# Patient Record
Sex: Female | Born: 1955 | Hispanic: No | Marital: Married | Smoking: Never smoker
Health system: Southern US, Community
[De-identification: ages and names within clinical notes are randomized; demographics above are authoritative.]

## PROBLEM LIST (undated history)

## (undated) DIAGNOSIS — K219 Gastro-esophageal reflux disease without esophagitis: Secondary | ICD-10-CM

## (undated) DIAGNOSIS — M6530 Trigger finger, unspecified finger: Secondary | ICD-10-CM

---

## 1997-10-12 ENCOUNTER — Other Ambulatory Visit: Admission: RE | Admit: 1997-10-12 | Discharge: 1997-10-12 | Payer: Self-pay | Admitting: Obstetrics and Gynecology

## 1998-03-11 ENCOUNTER — Other Ambulatory Visit: Admission: RE | Admit: 1998-03-11 | Discharge: 1998-03-11 | Payer: Self-pay | Admitting: Obstetrics and Gynecology

## 1998-11-30 ENCOUNTER — Other Ambulatory Visit: Admission: RE | Admit: 1998-11-30 | Discharge: 1998-11-30 | Payer: Self-pay | Admitting: Obstetrics and Gynecology

## 2001-05-01 ENCOUNTER — Other Ambulatory Visit: Admission: RE | Admit: 2001-05-01 | Discharge: 2001-05-01 | Payer: Self-pay | Admitting: Obstetrics and Gynecology

## 2005-02-06 ENCOUNTER — Other Ambulatory Visit: Admission: RE | Admit: 2005-02-06 | Discharge: 2005-02-06 | Payer: Self-pay | Admitting: Obstetrics and Gynecology

## 2005-04-10 ENCOUNTER — Ambulatory Visit: Payer: Self-pay | Admitting: Gastroenterology

## 2008-09-09 ENCOUNTER — Emergency Department (HOSPITAL_COMMUNITY): Admission: EM | Admit: 2008-09-09 | Discharge: 2008-09-09 | Payer: Self-pay | Admitting: Emergency Medicine

## 2013-10-20 ENCOUNTER — Encounter: Payer: Self-pay | Admitting: Sports Medicine

## 2013-10-20 ENCOUNTER — Ambulatory Visit (INDEPENDENT_AMBULATORY_CARE_PROVIDER_SITE_OTHER): Payer: No Typology Code available for payment source | Admitting: Sports Medicine

## 2013-10-20 ENCOUNTER — Ambulatory Visit
Admission: RE | Admit: 2013-10-20 | Discharge: 2013-10-20 | Disposition: A | Discharge status: Home / Self Care | Payer: No Typology Code available for payment source | Source: Ambulatory Visit | Attending: Sports Medicine | Admitting: Sports Medicine

## 2013-10-20 VITALS — BP 146/73 | HR 67 | Ht 62.0 in | Wt 150.0 lb

## 2013-10-20 DIAGNOSIS — M79609 Pain in unspecified limb: Secondary | ICD-10-CM

## 2013-10-20 DIAGNOSIS — M79645 Pain in left finger(s): Secondary | ICD-10-CM

## 2013-10-20 NOTE — Progress Notes (Signed)
Pt presents for L thumb pain x 2 weeks. 1 month ago, patient reached to pet a dog who jumped and she jambed her thumb into it's head. She had pain instantly but no swelling or bruising. She then had no pain until 2 weeks ago when she noted pain at the IP joint and swelling of the entire them. She noted that it was very painful with flexion and would "come out of joint" and need to be put back in which was very painful. She tried ice which did not help. She also put a splint on it which has helped prevent her from bending or hitting it in her everyday activities.  On exam she has limited ROM at IP and MCP joints. She does have some active flexion of the IP which is limited by pain. She has diffuse swelling along the thumb and 1st metacarpal. She is tender to palpation at the A1 trigger and has mild bruising on the dorsal surface of the IP joint.

## 2013-10-20 NOTE — Progress Notes (Signed)
   Subjective:    Patient ID: Latoya Santos, female    DOB: 01/15/1956, 58 y.o.   MRN: [id number]  HPI chief complaint: Left thumb pain  Very pleasant 58 year old right-hand-dominant female comes in today complaining of 2 weeks of left thumb pain. She suffered an axial loading injury to this thumb 4 weeks ago and did not experience any immediate pain but 2 weeks later began to experience swelling and pain that she localizes both to the MCP joint as well as the IP joint. She has noticed an inability to flex the finger at the IP joint. She has been applying a self-made U-splint to the tip of her thumb which has been somewhat helpful. No numbness or tingling. No pain more proximally in the wrist. No previous problems with this thumb and no prior thumb surgery.  Past medical history reviewed Medications reviewed Allergies reviewed    Review of Systems     Objective:   Physical Exam Well-developed. No acute distress  Left thumb: There is limited motion at the IP joint but I do appreciate some active flexion. Passive flexion as well. There is palpable tenderness in the area of the A1 pulley. Some tenderness to palpation at the dorsum of the proximal phalanx as well. Good ulnar collateral ligament stability. Extensor tendon is intact. No clinical angulation or malrotation. Brisk capillary refill at the tip of the thumb. Skin is normal color and normal temperature. No effusion. No soft tissue swelling.  X-rays of the left thumb done at [facility] imaging including AP, lateral, and oblique views are obtained. Radiologist comments that there is a cortical contour abnormality at the dorsal aspect of the base of the proximal left phalanx. Interpretation is that of a possible developmental abnormality versus nondisplaced fracture.       Assessment & Plan:  Left thumb pain with x-ray evidence of possible nondisplaced proximal phalanx fracture versus flexor tendon injury versus posttraumatic flexor  tenosynovitis  I will make a referral to the hand surgeons for their opinion. I'll defer further workup and treatment of their discretion. Patient will followup with me when necessary.

## 2013-10-20 NOTE — Patient Instructions (Addendum)
[facility] Dr. Janee Morn Thursday October 1st at 915am [address] [place]  [phone]

## 2013-10-20 NOTE — Assessment & Plan Note (Addendum)
Likely trigger finger - xray to r/o fracture given recent trauma - will call patient this pm with results - likely referral to hand later this week

## 2015-02-22 ENCOUNTER — Other Ambulatory Visit: Payer: Self-pay | Admitting: Orthopedic Surgery

## 2015-03-08 ENCOUNTER — Encounter (HOSPITAL_BASED_OUTPATIENT_CLINIC_OR_DEPARTMENT_OTHER): Payer: Self-pay | Admitting: *Deleted

## 2015-03-11 NOTE — H&P (Signed)
Latoya Santos is an 60 y.o. female.   CC / Reason for Visit: Left thumb painful catching HPI: This patient returns for reevaluation, indicating that she's had some improvement in her left thumb trigger digit following repeat injection in December of 2015.  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  He reports that the thumb was struck on the end hitting a dog on the date above and 2-3 weeks later she developed locking and catching.  Now, it hurts enough that she does not want to bend the thumb.  She thinks that the terminal joint is "jumping out of place"  Past Medical History  Diagnosis Date  . GERD (gastroesophageal reflux disease)   . Trigger finger of left hand     thumb    Past Surgical History  Procedure Laterality Date  . Cesarean section      x3    History reviewed. No pertinent family history. Social History:  reports that she has never smoked. She does not have any smokeless tobacco history on file. She reports that she drinks alcohol. She reports that she does not use illicit drugs.  Allergies: No Known Allergies  No prescriptions prior to admission    No results found for this or any previous visit (from the past 48 hour(s)). No results found.  Review of Systems  All other systems reviewed and are negative.   Height  (1.575 m), weight 77.111 kg (170 lb). Physical Exam  Constitutional:  WD, WN, NAD HEENT:  NCAT, EOMI Neuro/Psych:  Alert & oriented to person, place, and time; appropriate mood & affect Lymphatic: No generalized UE edema or lymphadenopathy Extremities / MSK:  Both UE are normal with respect to appearance, ranges of motion, joint stability, muscle strength/tone, sensation, & perfusion except as otherwise noted:  Left thumb .  Mildly tender over the A1 pulley.   FPL intact, but poor IP flexion of only 5 versus 70 on the right.  Labs / X-rays:  No radiographic studies obtained today.  Assessment: Left trigger persistent despite injections on 10-23-13 and 12-23-13  Plan:  I discussed these findings with her and she would like to proceed surgically with a left trigger thumb release.  The details of the operative procedure were discussed with the patient.  Questions were invited and answered.  In addition to the goal of the procedure, the risks of the procedure to include but not limited to bleeding; infection; damage to the nerves or blood vessels that could result in bleeding, numbness, weakness, chronic pain, and the need for additional procedures; stiffness; the need for revision surgery; and anesthetic risks, were reviewed.  No specific outcome was guaranteed or implied.  Informed consent was obtained.  She worked with Rosanne Sack today to schedule this in the near future.  Ecklund, Brittnae Aschenbrenner A., MD 03/11/2015, 6:53 PM

## 2015-03-15 ENCOUNTER — Encounter (HOSPITAL_BASED_OUTPATIENT_CLINIC_OR_DEPARTMENT_OTHER)
Admission: RE | Disposition: A | Discharge status: Home / Self Care | Payer: Self-pay | Source: Ambulatory Visit | Attending: Orthopedic Surgery

## 2015-03-15 ENCOUNTER — Ambulatory Visit (HOSPITAL_BASED_OUTPATIENT_CLINIC_OR_DEPARTMENT_OTHER)
Admission: RE | Admit: 2015-03-15 | Discharge: 2015-03-15 | Disposition: A | Discharge status: Home / Self Care | Payer: BLUE CROSS/BLUE SHIELD | Source: Ambulatory Visit | Attending: Orthopedic Surgery | Admitting: Orthopedic Surgery

## 2015-03-15 ENCOUNTER — Ambulatory Visit (HOSPITAL_BASED_OUTPATIENT_CLINIC_OR_DEPARTMENT_OTHER): Payer: BLUE CROSS/BLUE SHIELD | Admitting: Certified Registered"

## 2015-03-15 ENCOUNTER — Encounter (HOSPITAL_BASED_OUTPATIENT_CLINIC_OR_DEPARTMENT_OTHER): Payer: Self-pay | Admitting: Certified Registered"

## 2015-03-15 DIAGNOSIS — E669 Obesity, unspecified: Secondary | ICD-10-CM | POA: Diagnosis not present

## 2015-03-15 DIAGNOSIS — M65312 Trigger thumb, left thumb: Secondary | ICD-10-CM | POA: Insufficient documentation

## 2015-03-15 DIAGNOSIS — Z6831 Body mass index (BMI) 31.0-31.9, adult: Secondary | ICD-10-CM | POA: Diagnosis not present

## 2015-03-15 DIAGNOSIS — K219 Gastro-esophageal reflux disease without esophagitis: Secondary | ICD-10-CM | POA: Insufficient documentation

## 2015-03-15 HISTORY — DX: Trigger finger, unspecified finger: M65.30

## 2015-03-15 HISTORY — PX: TRIGGER FINGER RELEASE: SHX641

## 2015-03-15 HISTORY — DX: Gastro-esophageal reflux disease without esophagitis: K21.9

## 2015-03-15 SURGERY — RELEASE, A1 PULLEY, FOR TRIGGER FINGER
Anesthesia: General | Site: Thumb | Laterality: Left

## 2015-03-15 MED ORDER — HYDROCODONE-ACETAMINOPHEN 7.5-325 MG PO TABS: 1.0000 | ORAL_TABLET | Freq: Once | ORAL | Status: DC | PRN

## 2015-03-15 MED ORDER — GLYCOPYRROLATE 0.2 MG/ML IJ SOLN: 0.2000 mg | Freq: Once | INTRAMUSCULAR | Status: DC | PRN

## 2015-03-15 MED ORDER — PROPOFOL 10 MG/ML IV BOLUS
INTRAVENOUS | Status: DC | PRN
  Administered 2015-03-15 (×2): 20 mg via INTRAVENOUS

## 2015-03-15 MED ORDER — BUPIVACAINE-EPINEPHRINE (PF) 0.5% -1:200000 IJ SOLN
INTRAMUSCULAR | Status: AC
  Filled 2015-03-15: qty 30

## 2015-03-15 MED ORDER — LACTATED RINGERS IV SOLN: INTRAVENOUS | Status: DC

## 2015-03-15 MED ORDER — ONDANSETRON HCL 4 MG/2ML IJ SOLN
INTRAMUSCULAR | Status: DC | PRN
  Administered 2015-03-15: 4 mg via INTRAVENOUS

## 2015-03-15 MED ORDER — MIDAZOLAM HCL 2 MG/2ML IJ SOLN
1.0000 mg | INTRAMUSCULAR | Status: DC | PRN
  Administered 2015-03-15: 2 mg via INTRAVENOUS

## 2015-03-15 MED ORDER — LACTATED RINGERS IV SOLN
INTRAVENOUS | Status: DC
  Administered 2015-03-15: 10:00:00 via INTRAVENOUS

## 2015-03-15 MED ORDER — MEPERIDINE HCL 25 MG/ML IJ SOLN: 6.2500 mg | INTRAMUSCULAR | Status: DC | PRN

## 2015-03-15 MED ORDER — MIDAZOLAM HCL 2 MG/2ML IJ SOLN
INTRAMUSCULAR | Status: AC
  Filled 2015-03-15: qty 2

## 2015-03-15 MED ORDER — SCOPOLAMINE 1 MG/3DAYS TD PT72: 1.0000 | MEDICATED_PATCH | Freq: Once | TRANSDERMAL | Status: DC | PRN

## 2015-03-15 MED ORDER — LIDOCAINE HCL (CARDIAC) 20 MG/ML IV SOLN
INTRAVENOUS | Status: DC | PRN
  Administered 2015-03-15: 30 mg via INTRAVENOUS

## 2015-03-15 MED ORDER — METOCLOPRAMIDE HCL 5 MG/ML IJ SOLN: 10.0000 mg | Freq: Once | INTRAMUSCULAR | Status: DC | PRN

## 2015-03-15 MED ORDER — LIDOCAINE HCL 2 % IJ SOLN
INTRAMUSCULAR | Status: DC | PRN
  Administered 2015-03-15: 5 mL

## 2015-03-15 MED ORDER — PROPOFOL 10 MG/ML IV BOLUS
INTRAVENOUS | Status: AC
  Filled 2015-03-15: qty 20

## 2015-03-15 MED ORDER — FENTANYL CITRATE (PF) 100 MCG/2ML IJ SOLN
50.0000 ug | INTRAMUSCULAR | Status: DC | PRN
  Administered 2015-03-15: 50 ug via INTRAVENOUS

## 2015-03-15 MED ORDER — FENTANYL CITRATE (PF) 100 MCG/2ML IJ SOLN
INTRAMUSCULAR | Status: AC
  Filled 2015-03-15: qty 2

## 2015-03-15 MED ORDER — CEFAZOLIN SODIUM-DEXTROSE 2-3 GM-% IV SOLR
2.0000 g | INTRAVENOUS | Status: AC
  Administered 2015-03-15: 2 g via INTRAVENOUS

## 2015-03-15 MED ORDER — FENTANYL CITRATE (PF) 100 MCG/2ML IJ SOLN: 25.0000 ug | INTRAMUSCULAR | Status: DC | PRN

## 2015-03-15 MED ORDER — ONDANSETRON HCL 4 MG/2ML IJ SOLN
INTRAMUSCULAR | Status: AC
Start: 2015-03-15 — End: 2015-03-15
  Filled 2015-03-15: qty 2

## 2015-03-15 MED ORDER — BUPIVACAINE-EPINEPHRINE 0.5% -1:200000 IJ SOLN
INTRAMUSCULAR | Status: DC | PRN
  Administered 2015-03-15: 5 mL

## 2015-03-15 MED ORDER — LIDOCAINE HCL 2 % IJ SOLN
INTRAMUSCULAR | Status: AC
  Filled 2015-03-15: qty 20

## 2015-03-15 MED ORDER — LIDOCAINE HCL (CARDIAC) 20 MG/ML IV SOLN
INTRAVENOUS | Status: AC
  Filled 2015-03-15: qty 5

## 2015-03-15 MED ORDER — HYDROCODONE-ACETAMINOPHEN 5-325 MG PO TABS: 1.0000 | ORAL_TABLET | Freq: Four times a day (QID) | ORAL | Status: AC | PRN

## 2015-03-15 SURGICAL SUPPLY — 38 items
BLADE MINI RND TIP GREEN BEAV (BLADE) IMPLANT
BLADE SURG 15 STRL LF DISP TIS (BLADE) ×1 IMPLANT
BLADE SURG 15 STRL SS (BLADE) ×3
BNDG CMPR 9X4 STRL LF SNTH (GAUZE/BANDAGES/DRESSINGS) ×1
BNDG COHESIVE 1X5 TAN STRL LF (GAUZE/BANDAGES/DRESSINGS) ×3 IMPLANT
BNDG CONFORM 2 STRL LF (GAUZE/BANDAGES/DRESSINGS) ×3 IMPLANT
BNDG ESMARK 4X9 LF (GAUZE/BANDAGES/DRESSINGS) ×3 IMPLANT
CHLORAPREP W/TINT 26ML (MISCELLANEOUS) ×3 IMPLANT
COVER BACK TABLE 60X90IN (DRAPES) ×3 IMPLANT
COVER MAYO STAND STRL (DRAPES) ×3 IMPLANT
CUFF TOURNIQUET SINGLE 18IN (TOURNIQUET CUFF) ×3 IMPLANT
DRAPE EXTREMITY T 121X128X90 (DRAPE) ×3 IMPLANT
DRAPE SURG 17X23 STRL (DRAPES) ×3 IMPLANT
DRSG EMULSION OIL 3X3 NADH (GAUZE/BANDAGES/DRESSINGS) ×3 IMPLANT
GLOVE BIO SURGEON STRL SZ7.5 (GLOVE) ×3 IMPLANT
GLOVE BIOGEL PI IND STRL 7.0 (GLOVE) ×2 IMPLANT
GLOVE BIOGEL PI IND STRL 8 (GLOVE) ×1 IMPLANT
GLOVE BIOGEL PI INDICATOR 7.0 (GLOVE) ×4
GLOVE BIOGEL PI INDICATOR 8 (GLOVE) ×2
GLOVE ECLIPSE 6.5 STRL STRAW (GLOVE) ×6 IMPLANT
GOWN STRL REUS W/ TWL LRG LVL3 (GOWN DISPOSABLE) ×2 IMPLANT
GOWN STRL REUS W/TWL LRG LVL3 (GOWN DISPOSABLE) ×6
GOWN STRL REUS W/TWL XL LVL3 (GOWN DISPOSABLE) ×3 IMPLANT
NDL SAFETY ECLIPSE 18X1.5 (NEEDLE) ×1 IMPLANT
NEEDLE HYPO 18GX1.5 SHARP (NEEDLE) ×3
NEEDLE HYPO 25X1 1.5 SAFETY (NEEDLE) ×3 IMPLANT
NS IRRIG 1000ML POUR BTL (IV SOLUTION) ×3 IMPLANT
PACK BASIN DAY SURGERY FS (CUSTOM PROCEDURE TRAY) ×3 IMPLANT
SPONGE GAUZE 4X4 12PLY STER LF (GAUZE/BANDAGES/DRESSINGS) ×3 IMPLANT
STOCKINETTE 6  STRL (DRAPES) ×2
STOCKINETTE 6 STRL (DRAPES) ×1 IMPLANT
SUT VICRYL RAPIDE 4-0 (SUTURE) IMPLANT
SUT VICRYL RAPIDE 4/0 PS 2 (SUTURE) ×3 IMPLANT
SYR BULB 3OZ (MISCELLANEOUS) ×3 IMPLANT
SYRINGE 10CC LL (SYRINGE) ×3 IMPLANT
TOWEL OR 17X24 6PK STRL BLUE (TOWEL DISPOSABLE) ×3 IMPLANT
TOWEL OR NON WOVEN STRL DISP B (DISPOSABLE) IMPLANT
UNDERPAD 30X30 (UNDERPADS AND DIAPERS) ×3 IMPLANT

## 2015-03-15 NOTE — Anesthesia Postprocedure Evaluation (Signed)
Anesthesia Post Note  Patient: Latoya Santos  Procedure(s) Performed: Procedure(s) (LRB): LEFT TRIGGER THUMB RELEASE (Left)  Patient location during evaluation: PACU Level of consciousness: awake and alert and oriented Pain management: pain level controlled Vital Signs Assessment: post-procedure vital signs reviewed and stable Respiratory status: spontaneous breathing, nonlabored ventilation and respiratory function stable Cardiovascular status: blood pressure returned to baseline and stable Postop Assessment: no signs of nausea or vomiting Anesthetic complications: no    Last Vitals:  Filed Vitals:   03/15/15 1130 03/15/15 1159  BP: 126/65 151/70  Pulse: 65 52  Temp:  36.4 C  Resp: 19 18    Last Pain: There were no vitals filed for this visit.               Dracen Reigle A.

## 2015-03-15 NOTE — Interval H&P Note (Signed)
History and Physical Interval Note:  03/15/2015 10:45 AM  Latoya Santos  has presented today for surgery, with the diagnosis of LEFT THUMB TRIGGER DIGIT M65.312  The various methods of treatment have been discussed with the patient and family. After consideration of risks, benefits and other options for treatment, the patient has consented to  Procedure(s): LEFT TRIGGER THUMB RELEASE (Left) as a surgical intervention .  The patient's history has been reviewed, patient examined, no change in status, stable for surgery.  I have reviewed the patient's chart and labs.  Questions were answered to the patient's satisfaction.     Mutch, Brilyn Tuller A.

## 2015-03-15 NOTE — Discharge Instructions (Signed)
Discharge Instructions   You have a light dressing on your hand.  You may begin gentle motion of your fingers and hand immediately, but you should not do any heavy lifting or gripping.  Elevate your hand to reduce pain & swelling of the digits.  Ice over the operative site may be helpful to reduce pain & swelling.  DO NOT USE HEAT. Pain medicine has been prescribed for you.  Use your medicine as needed over the first 48 hours, and then you can begin to taper your use. You may use Tylenol in place of your prescribed pain medication, but not IN ADDITION to it. Leave the dressing in place until the third day after your surgery and then remove it, leaving it open to air.  After the bandage has been removed you may shower, regularly washing the incision and letting the water run over it, but not submerging it (no swimming, soaking it in dishwater, etc.) You may drive a car when you are off of prescription pain medications and can safely control your vehicle with both hands. We will address whether therapy will be required or not when you return to the office. You may have already made your follow-up appointment when we completed your preop visit.  If not, please call our office today or the next business day to make your return appointment for 10-15 days after surgery.   Please call [phone] during normal business hours or [phone] after hours for any problems. Including the following:  - excessive redness of the incisions - drainage for more than 4 days - fever of more than 101.5 F  *Please note that pain medications will not be refilled after hours or on weekends.   Post Anesthesia Home Care Instructions  Activity: Get plenty of rest for the remainder of the day. A responsible adult should stay with you for 24 hours following the procedure.  For the next 24 hours, DO NOT: -Drive a car -Advertising copywriter -Drink alcoholic beverages -Take any medication unless instructed by your  physician -Make any legal decisions or sign important papers.  Meals: Start with liquid foods such as gelatin or soup. Progress to regular foods as tolerated. Avoid greasy, spicy, heavy foods. If nausea and/or vomiting occur, drink only clear liquids until the nausea and/or vomiting subsides. Call your physician if vomiting continues.  Special Instructions/Symptoms: Your throat may feel dry or sore from the anesthesia or the breathing tube placed in your throat during surgery. If this causes discomfort, gargle with warm salt water. The discomfort should disappear within 24 hours.  If you had a scopolamine patch placed behind your ear for the management of post- operative nausea and/or vomiting:  1. The medication in the patch is effective for 72 hours, after which it should be removed.  Wrap patch in a tissue and discard in the trash. Wash hands thoroughly with soap and water. 2. You may remove the patch earlier than 72 hours if you experience unpleasant side effects which may include dry mouth, dizziness or visual disturbances. 3. Avoid touching the patch. Wash your hands with soap and water after contact with the patch.    Post Anesthesia Home Care Instructions  Activity: Get plenty of rest for the remainder of the day. A responsible adult should stay with you for 24 hours following the procedure.  For the next 24 hours, DO NOT: -Drive a car -Advertising copywriter -Drink alcoholic beverages -Take any medication unless instructed by your physician -Make any legal decisions or sign  important papers.  Meals: Start with liquid foods such as gelatin or soup. Progress to regular foods as tolerated. Avoid greasy, spicy, heavy foods. If nausea and/or vomiting occur, drink only clear liquids until the nausea and/or vomiting subsides. Call your physician if vomiting continues.  Special Instructions/Symptoms: Your throat may feel dry or sore from the anesthesia or the breathing tube placed in your  throat during surgery. If this causes discomfort, gargle with warm salt water. The discomfort should disappear within 24 hours.  If you had a scopolamine patch placed behind your ear for the management of post- operative nausea and/or vomiting:  1. The medication in the patch is effective for 72 hours, after which it should be removed.  Wrap patch in a tissue and discard in the trash. Wash hands thoroughly with soap and water. 2. You may remove the patch earlier than 72 hours if you experience unpleasant side effects which may include dry mouth, dizziness or visual disturbances. 3. Avoid touching the patch. Wash your hands with soap and water after contact with the patch.

## 2015-03-15 NOTE — Anesthesia Preprocedure Evaluation (Signed)
Anesthesia Evaluation  Patient identified by MRN, date of birth, ID band Patient awake    Reviewed: Allergy & Precautions, NPO status , Patient's Chart, lab work & pertinent test results  Airway Mallampati: II  TM Distance: >3 FB Neck ROM: Full    Dental no notable dental hx. (+) Teeth Intact   Pulmonary neg pulmonary ROS,    Pulmonary exam normal breath sounds clear to auscultation       Cardiovascular negative cardio ROS Normal cardiovascular exam Rhythm:Regular Rate:Normal     Neuro/Psych negative neurological ROS  negative psych ROS   GI/Hepatic Neg liver ROS, GERD  Medicated and Controlled,  Endo/Other  negative endocrine ROS  Renal/GU negative Renal ROS  negative genitourinary   Musculoskeletal negative musculoskeletal ROS (+) Left trigger thumb   Abdominal (+) + obese,   Peds  Hematology negative hematology ROS (+)   Anesthesia Other Findings   Reproductive/Obstetrics                             Anesthesia Physical Anesthesia Plan  ASA: II  Anesthesia Plan: General   Post-op Pain Management:    Induction: Intravenous  Airway Management Planned: LMA  Additional Equipment:   Intra-op Plan:   Post-operative Plan: Extubation in OR  Informed Consent: I have reviewed the patients History and Physical, chart, labs and discussed the procedure including the risks, benefits and alternatives for the proposed anesthesia with the patient or authorized representative who has indicated his/her understanding and acceptance.   Dental advisory given  Plan Discussed with: CRNA, Surgeon and Anesthesiologist  Anesthesia Plan Comments:         Anesthesia Quick Evaluation

## 2015-03-15 NOTE — Transfer of Care (Signed)
Immediate Anesthesia Transfer of Care Note  Patient: Latoya Santos  Procedure(s) Performed: Procedure(s): LEFT TRIGGER THUMB RELEASE (Left)  Patient Location: PACU  Anesthesia Type:MAC  Level of Consciousness: awake, alert , oriented and patient cooperative  Airway & Oxygen Therapy: Patient Spontanous Breathing and Patient connected to face mask oxygen  Post-op Assessment: Report given to RN and Post -op Vital signs reviewed and stable  Post vital signs: Reviewed and stable  Last Vitals:  Filed Vitals:   03/15/15 1017  BP: 175/61  Pulse: 69  Temp: 36.7 C  Resp: 20    Complications: No apparent anesthesia complications

## 2015-03-15 NOTE — Interval H&P Note (Signed)
History and Physical Interval Note:  03/15/2015 10:44 AM  Latoya Santos  has presented today for surgery, with the diagnosis of LEFT THUMB TRIGGER DIGIT M65.312  The various methods of treatment have been discussed with the patient and family. After consideration of risks, benefits and other options for treatment, the patient has consented to  Procedure(s): LEFT TRIGGER THUMB RELEASE (Left) as a surgical intervention .  The patient's history has been reviewed, patient examined, no change in status, stable for surgery.  I have reviewed the patient's chart and labs.  Questions were answered to the patient's satisfaction.     Rhodes, Danie Hannig A.

## 2015-03-15 NOTE — Op Note (Signed)
03/15/2015  10:45 AM  PATIENT:  Latoya Santos  60 y.o. female  PRE-OPERATIVE DIAGNOSIS:  Left thumb stenosing tenosynovitis  POST-OPERATIVE DIAGNOSIS:  Same  PROCEDURE:  Left thumb trigger release  SURGEON: Cliffton Asters. Janee Morn, MD  PHYSICIAN ASSISTANT: Danielle Rankin, OPA-C  ANESTHESIA:  local and MAC  SPECIMENS:  None  DRAINS:   None  EBL:  less than 50 mL  PREOPERATIVE INDICATIONS:  Latoya Santos is a  60 y.o. female with left trigger thumb that has  failed nonoperative management  The risks benefits and alternatives were discussed with the patient preoperatively including but not limited to the risks of infection, bleeding, nerve injury, cardiopulmonary complications, the need for revision surgery, among others, and the patient verbalized understanding and consented to proceed.  OPERATIVE IMPLANTS:  None  OPERATIVE PROCEDURE:  After receiving prophylactic antibiotics, the patient was escorted to the operative theatre and placed in a supine position.  A surgical "time-out" was performed during which the planned procedure, proposed operative site, and the correct patient identity were compared to the operative consent and agreement confirmed by the circulating nurse according to current facility policy.  Mixture of lidocaine and Marcaine was instilled in the region of the planned incision.Following application of a tourniquet to the operative extremity, the exposed skin was prepped with Chloraprep and draped in the usual sterile fashion.  The limb was exsanguinated with an Esmarch bandage and the tourniquet inflated to approximately higher than systolic BP.  A transverse incision was made at the base of the left thumb, protecting the crossing digital nerves. The A1 pulley was identified and incised midline, with care to protect the digital nerves and preserved the oblique pulley. There are no significant additional crossing bands proximal to the A1 pulley. The tendon was  pulled into view, found to have some longitudinal split tearing and fraying near the distal edge of the A1 pulley when in full extension. Debridement of the tendon was unnecessary. Tourniquet was released the wound irrigated and skin closed with 4-0 Vicryl Rapide interrupted sutures. A light dressing was applied she was taken recovery in stable condition.  DISPOSITION: She'll be discharged home and irritable instructions, returning 10-15 days.

## 2015-03-15 NOTE — Anesthesia Procedure Notes (Signed)
Procedure Name: MAC Date/Time: 03/15/2015 11:00 AM Performed by: Orazio Weller D Pre-anesthesia Checklist: Patient identified, Emergency Drugs available, Suction available, Patient being monitored and Timeout performed Patient Re-evaluated:Patient Re-evaluated prior to inductionOxygen Delivery Method: Simple face mask

## 2015-03-16 ENCOUNTER — Encounter (HOSPITAL_BASED_OUTPATIENT_CLINIC_OR_DEPARTMENT_OTHER): Payer: Self-pay | Admitting: Orthopedic Surgery

## 2017-01-10 DIAGNOSIS — D2362 Other benign neoplasm of skin of left upper limb, including shoulder: Secondary | ICD-10-CM | POA: Diagnosis not present

## 2017-01-10 DIAGNOSIS — D225 Melanocytic nevi of trunk: Secondary | ICD-10-CM | POA: Diagnosis not present

## 2017-01-10 DIAGNOSIS — D2361 Other benign neoplasm of skin of right upper limb, including shoulder: Secondary | ICD-10-CM | POA: Diagnosis not present

## 2017-01-10 DIAGNOSIS — D22111 Melanocytic nevi of right upper eyelid, including canthus: Secondary | ICD-10-CM | POA: Diagnosis not present

## 2017-02-22 DIAGNOSIS — D485 Neoplasm of uncertain behavior of skin: Secondary | ICD-10-CM | POA: Diagnosis not present

## 2017-02-22 DIAGNOSIS — L821 Other seborrheic keratosis: Secondary | ICD-10-CM | POA: Diagnosis not present

## 2017-02-22 DIAGNOSIS — L570 Actinic keratosis: Secondary | ICD-10-CM | POA: Diagnosis not present

## 2017-05-30 DIAGNOSIS — Z1231 Encounter for screening mammogram for malignant neoplasm of breast: Secondary | ICD-10-CM | POA: Diagnosis not present

## 2017-05-30 DIAGNOSIS — Z6833 Body mass index (BMI) 33.0-33.9, adult: Secondary | ICD-10-CM | POA: Diagnosis not present

## 2017-05-30 DIAGNOSIS — Z01419 Encounter for gynecological examination (general) (routine) without abnormal findings: Secondary | ICD-10-CM | POA: Diagnosis not present

## 2017-06-19 DIAGNOSIS — M25561 Pain in right knee: Secondary | ICD-10-CM | POA: Diagnosis not present

## 2017-07-17 DIAGNOSIS — M25561 Pain in right knee: Secondary | ICD-10-CM | POA: Diagnosis not present

## 2018-01-08 DIAGNOSIS — D2371 Other benign neoplasm of skin of right lower limb, including hip: Secondary | ICD-10-CM | POA: Diagnosis not present

## 2018-01-08 DIAGNOSIS — L821 Other seborrheic keratosis: Secondary | ICD-10-CM | POA: Diagnosis not present

## 2018-01-08 DIAGNOSIS — D225 Melanocytic nevi of trunk: Secondary | ICD-10-CM | POA: Diagnosis not present

## 2018-01-08 DIAGNOSIS — L718 Other rosacea: Secondary | ICD-10-CM | POA: Diagnosis not present

## 2018-05-09 DIAGNOSIS — H66002 Acute suppurative otitis media without spontaneous rupture of ear drum, left ear: Secondary | ICD-10-CM | POA: Diagnosis not present

## 2018-07-05 ENCOUNTER — Other Ambulatory Visit: Payer: Self-pay

## 2018-07-05 ENCOUNTER — Other Ambulatory Visit: Payer: BLUE CROSS/BLUE SHIELD

## 2018-07-05 DIAGNOSIS — Z20822 Contact with and (suspected) exposure to covid-19: Secondary | ICD-10-CM

## 2018-07-09 LAB — NOVEL CORONAVIRUS, NAA: SARS-CoV-2, NAA: NOT DETECTED

## 2018-11-12 DIAGNOSIS — Z6833 Body mass index (BMI) 33.0-33.9, adult: Secondary | ICD-10-CM | POA: Diagnosis not present

## 2018-11-12 DIAGNOSIS — Z01419 Encounter for gynecological examination (general) (routine) without abnormal findings: Secondary | ICD-10-CM | POA: Diagnosis not present

## 2018-11-25 DIAGNOSIS — Z1231 Encounter for screening mammogram for malignant neoplasm of breast: Secondary | ICD-10-CM | POA: Diagnosis not present

## 2018-11-27 ENCOUNTER — Other Ambulatory Visit: Payer: Self-pay | Admitting: Obstetrics and Gynecology

## 2018-11-27 DIAGNOSIS — R928 Other abnormal and inconclusive findings on diagnostic imaging of breast: Secondary | ICD-10-CM

## 2018-12-06 ENCOUNTER — Other Ambulatory Visit: Payer: Self-pay

## 2018-12-06 ENCOUNTER — Ambulatory Visit
Admission: RE | Admit: 2018-12-06 | Discharge: 2018-12-06 | Disposition: A | Discharge status: Home / Self Care | Payer: BC Managed Care – PPO | Source: Ambulatory Visit | Attending: Obstetrics and Gynecology | Admitting: Obstetrics and Gynecology

## 2018-12-06 ENCOUNTER — Ambulatory Visit: Payer: Self-pay

## 2018-12-06 DIAGNOSIS — R928 Other abnormal and inconclusive findings on diagnostic imaging of breast: Secondary | ICD-10-CM | POA: Diagnosis not present

## 2019-01-28 DIAGNOSIS — R05 Cough: Secondary | ICD-10-CM | POA: Diagnosis not present

## 2019-01-28 DIAGNOSIS — U071 COVID-19: Secondary | ICD-10-CM | POA: Diagnosis not present

## 2019-01-28 DIAGNOSIS — Z20828 Contact with and (suspected) exposure to other viral communicable diseases: Secondary | ICD-10-CM | POA: Diagnosis not present

## 2019-09-12 DIAGNOSIS — Z20822 Contact with and (suspected) exposure to covid-19: Secondary | ICD-10-CM | POA: Diagnosis not present

## 2019-10-02 DIAGNOSIS — L814 Other melanin hyperpigmentation: Secondary | ICD-10-CM | POA: Diagnosis not present

## 2019-10-02 DIAGNOSIS — L821 Other seborrheic keratosis: Secondary | ICD-10-CM | POA: Diagnosis not present

## 2019-10-02 DIAGNOSIS — D225 Melanocytic nevi of trunk: Secondary | ICD-10-CM | POA: Diagnosis not present

## 2019-10-02 DIAGNOSIS — D2361 Other benign neoplasm of skin of right upper limb, including shoulder: Secondary | ICD-10-CM | POA: Diagnosis not present

## 2019-11-20 DIAGNOSIS — Z6832 Body mass index (BMI) 32.0-32.9, adult: Secondary | ICD-10-CM | POA: Diagnosis not present

## 2019-11-20 DIAGNOSIS — Z01419 Encounter for gynecological examination (general) (routine) without abnormal findings: Secondary | ICD-10-CM | POA: Diagnosis not present

## 2019-12-06 DIAGNOSIS — Z20822 Contact with and (suspected) exposure to covid-19: Secondary | ICD-10-CM | POA: Diagnosis not present

## 2019-12-15 DIAGNOSIS — Z1231 Encounter for screening mammogram for malignant neoplasm of breast: Secondary | ICD-10-CM | POA: Diagnosis not present

## 2019-12-25 DIAGNOSIS — Z13820 Encounter for screening for osteoporosis: Secondary | ICD-10-CM | POA: Diagnosis not present

## 2019-12-29 DIAGNOSIS — Z1211 Encounter for screening for malignant neoplasm of colon: Secondary | ICD-10-CM | POA: Diagnosis not present

## 2020-04-06 IMAGING — MG MM DIGITAL DIAGNOSTIC UNILAT*L* W/ TOMO W/ CAD
4 series · 4 of 12 positions shown · non-contrast
Comparison: Previous exam(s).

CLINICAL DATA: Patient was called back from screening mammogram for
a possible asymmetry in the left breast.

EXAM:
DIGITAL DIAGNOSTIC UNILATERAL LEFT MAMMOGRAM WITH CAD AND TOMO

[L ML synth-2D]
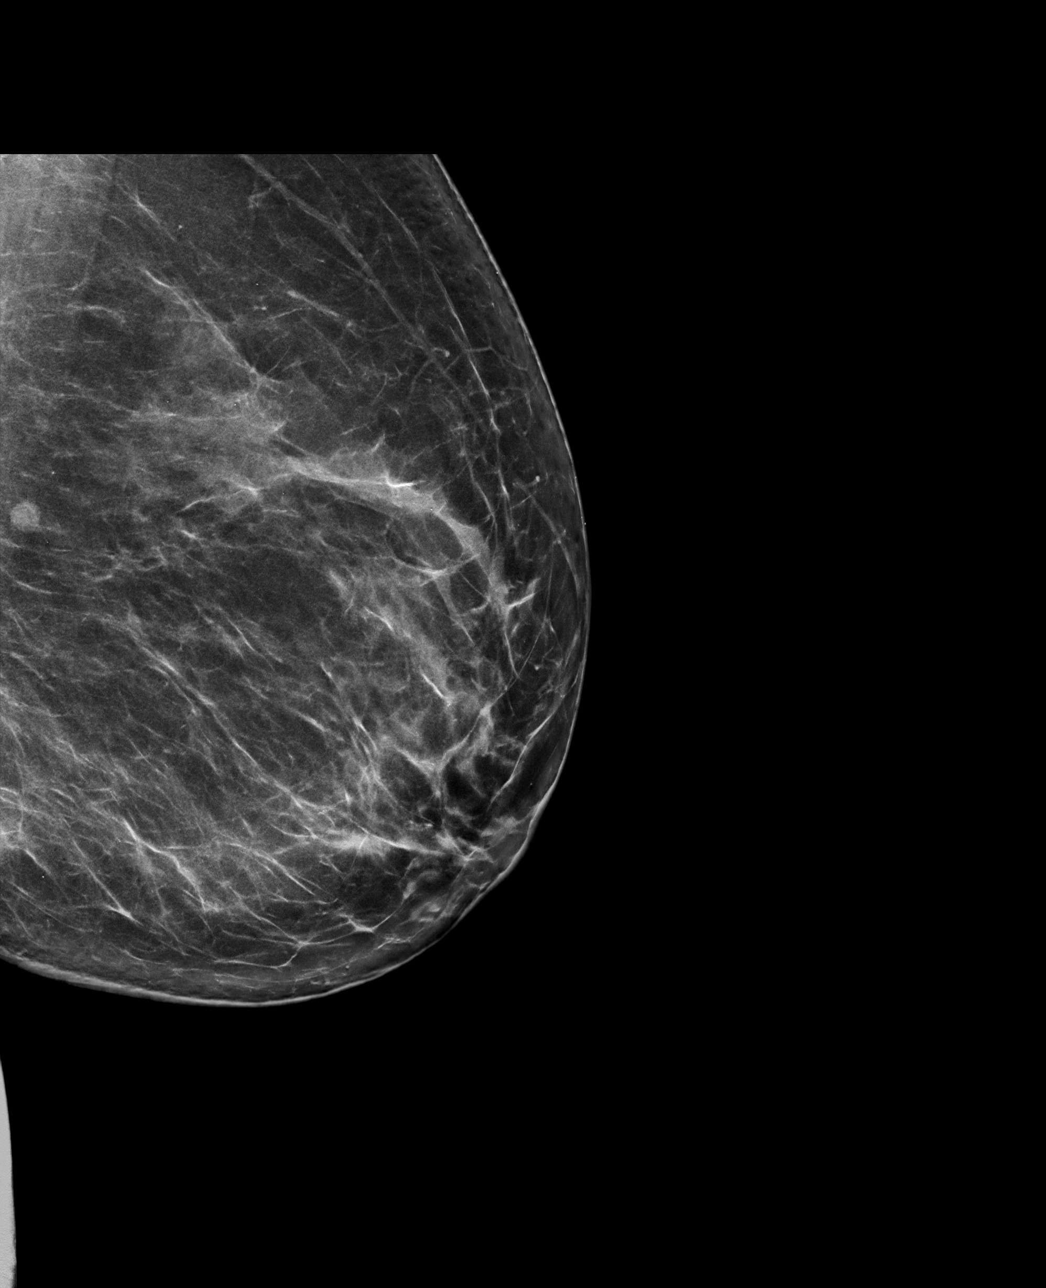

[L CC synth-2D]
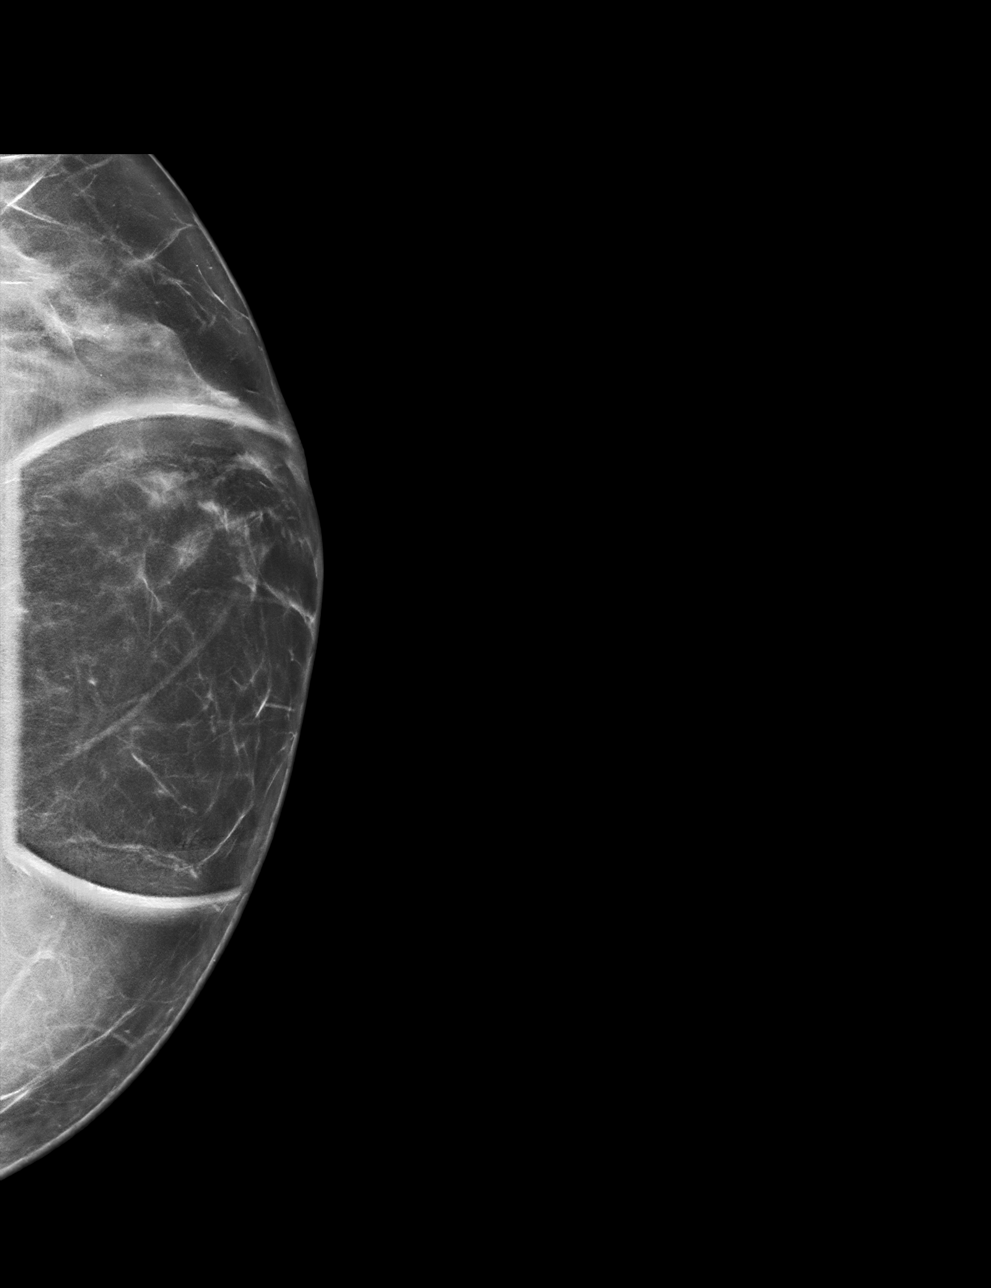

[L CC tomo · tomo slice 34/67.0]
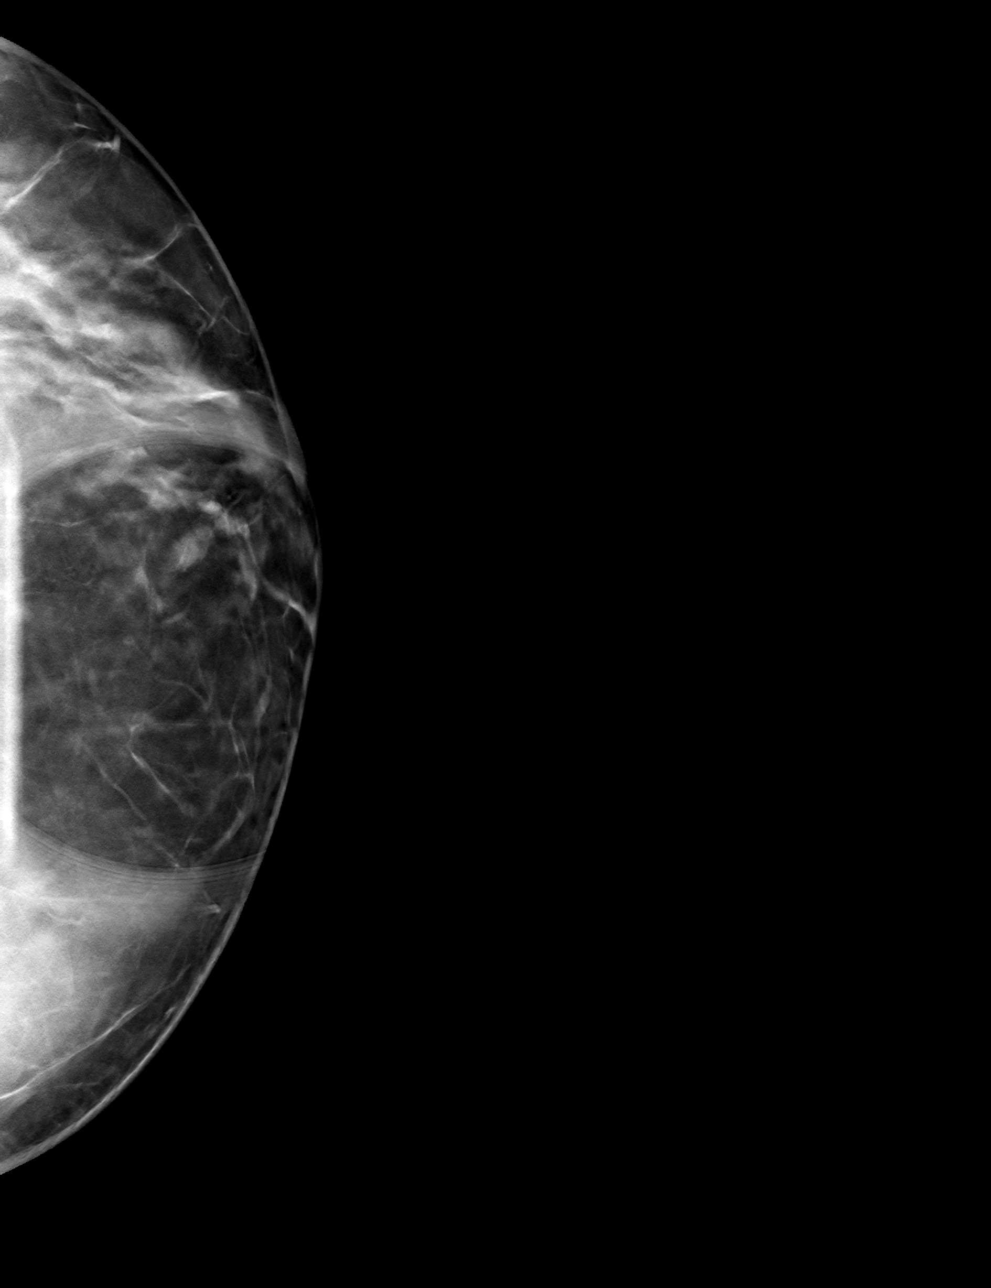

[L ML tomo · tomo slice 45/89.0]
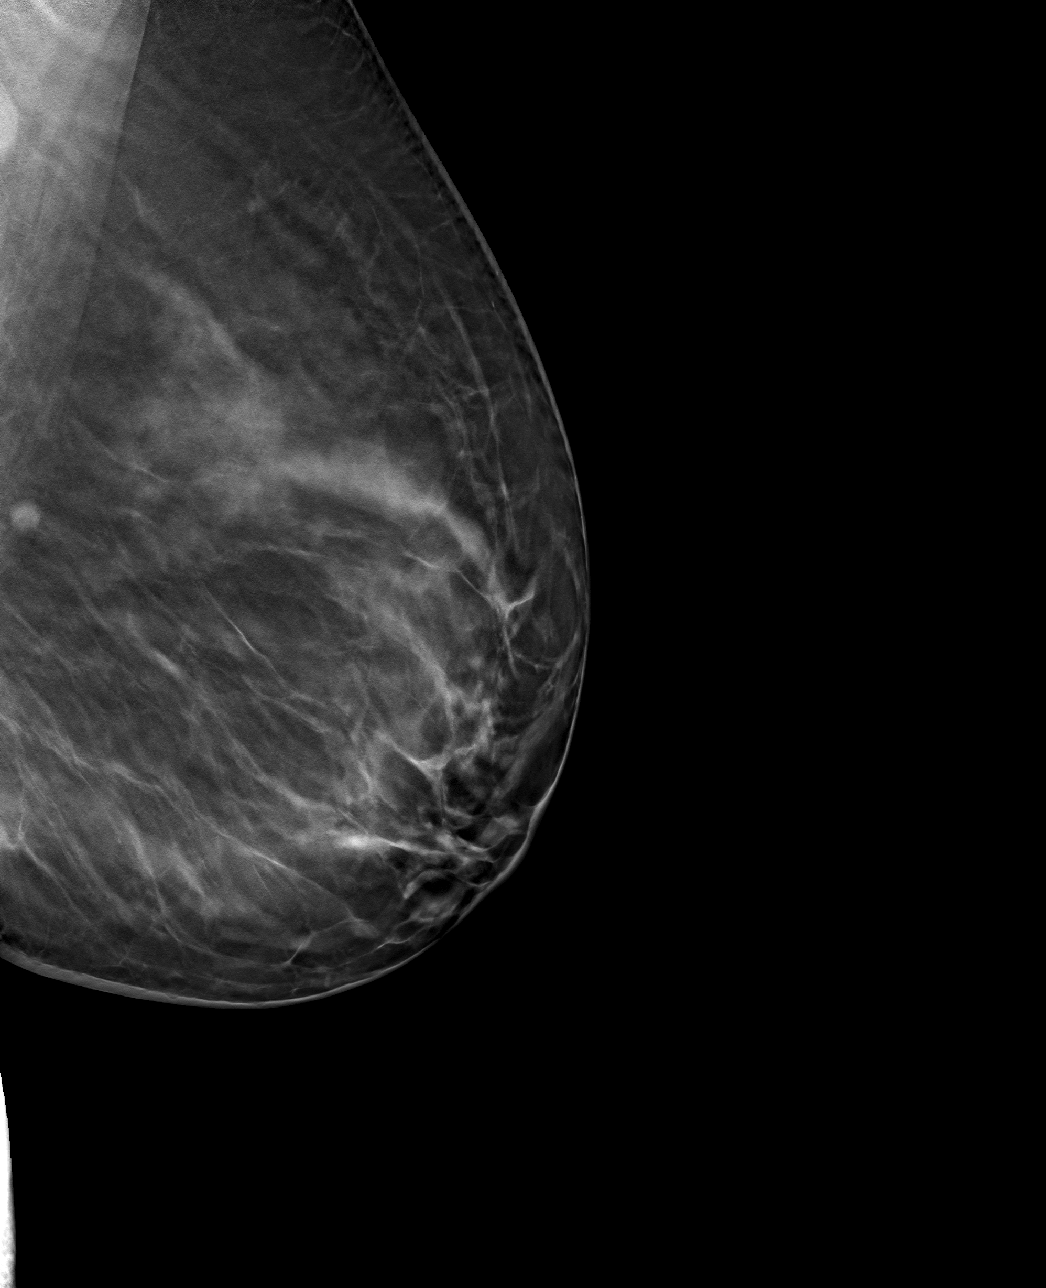

[4 of 12 positions shown; findings below may reference images not displayed]

ACR Breast Density Category b: There are scattered areas of
fibroglandular density.
FINDINGS: Additional imaging of the left breast was performed. No persistent
mass, distortion or malignant type microcalcifications identified.

Mammographic images were processed with CAD.
IMPRESSION: No evidence of malignancy in the left breast.

RECOMMENDATION:
Bilateral screening mammogram in 1 year is recommended.

I have discussed the findings and recommendations with the patient.
If applicable, a reminder letter will be sent to the patient
regarding the next appointment.

BI-RADS CATEGORY  1: Negative.

## 2020-08-24 DIAGNOSIS — M65311 Trigger thumb, right thumb: Secondary | ICD-10-CM | POA: Diagnosis not present

## 2020-10-05 DIAGNOSIS — M65311 Trigger thumb, right thumb: Secondary | ICD-10-CM | POA: Diagnosis not present

## 2021-10-12 DIAGNOSIS — M65311 Trigger thumb, right thumb: Secondary | ICD-10-CM | POA: Diagnosis not present

## 2021-12-11 DIAGNOSIS — L259 Unspecified contact dermatitis, unspecified cause: Secondary | ICD-10-CM | POA: Diagnosis not present

## 2022-02-07 DIAGNOSIS — M65351 Trigger finger, right little finger: Secondary | ICD-10-CM | POA: Diagnosis not present

## 2022-03-14 DIAGNOSIS — Z7689 Persons encountering health services in other specified circumstances: Secondary | ICD-10-CM | POA: Diagnosis not present

## 2022-03-14 DIAGNOSIS — N952 Postmenopausal atrophic vaginitis: Secondary | ICD-10-CM | POA: Diagnosis not present

## 2022-03-14 DIAGNOSIS — Z124 Encounter for screening for malignant neoplasm of cervix: Secondary | ICD-10-CM | POA: Diagnosis not present

## 2022-03-14 DIAGNOSIS — Z6829 Body mass index (BMI) 29.0-29.9, adult: Secondary | ICD-10-CM | POA: Diagnosis not present

## 2022-03-14 DIAGNOSIS — Z1231 Encounter for screening mammogram for malignant neoplasm of breast: Secondary | ICD-10-CM | POA: Diagnosis not present

## 2022-06-14 DIAGNOSIS — Z0000 Encounter for general adult medical examination without abnormal findings: Secondary | ICD-10-CM | POA: Diagnosis not present

## 2022-06-14 DIAGNOSIS — Z23 Encounter for immunization: Secondary | ICD-10-CM | POA: Diagnosis not present

## 2022-06-14 DIAGNOSIS — H9193 Unspecified hearing loss, bilateral: Secondary | ICD-10-CM | POA: Diagnosis not present

## 2022-06-14 DIAGNOSIS — Z13220 Encounter for screening for lipoid disorders: Secondary | ICD-10-CM | POA: Diagnosis not present

## 2022-06-14 DIAGNOSIS — R030 Elevated blood-pressure reading, without diagnosis of hypertension: Secondary | ICD-10-CM | POA: Diagnosis not present

## 2022-06-29 DIAGNOSIS — R7301 Impaired fasting glucose: Secondary | ICD-10-CM | POA: Diagnosis not present

## 2022-06-29 DIAGNOSIS — Z0000 Encounter for general adult medical examination without abnormal findings: Secondary | ICD-10-CM | POA: Diagnosis not present

## 2022-06-29 DIAGNOSIS — Z13220 Encounter for screening for lipoid disorders: Secondary | ICD-10-CM | POA: Diagnosis not present

## 2022-06-29 DIAGNOSIS — Z136 Encounter for screening for cardiovascular disorders: Secondary | ICD-10-CM | POA: Diagnosis not present

## 2022-06-29 DIAGNOSIS — R030 Elevated blood-pressure reading, without diagnosis of hypertension: Secondary | ICD-10-CM | POA: Diagnosis not present

## 2022-07-13 DIAGNOSIS — M25442 Effusion, left hand: Secondary | ICD-10-CM | POA: Diagnosis not present

## 2022-07-13 DIAGNOSIS — E119 Type 2 diabetes mellitus without complications: Secondary | ICD-10-CM | POA: Diagnosis not present

## 2022-10-26 DIAGNOSIS — R030 Elevated blood-pressure reading, without diagnosis of hypertension: Secondary | ICD-10-CM | POA: Diagnosis not present

## 2022-10-26 DIAGNOSIS — E119 Type 2 diabetes mellitus without complications: Secondary | ICD-10-CM | POA: Diagnosis not present

## 2023-03-01 DIAGNOSIS — D2362 Other benign neoplasm of skin of left upper limb, including shoulder: Secondary | ICD-10-CM | POA: Diagnosis not present

## 2023-03-01 DIAGNOSIS — D2361 Other benign neoplasm of skin of right upper limb, including shoulder: Secondary | ICD-10-CM | POA: Diagnosis not present

## 2023-03-01 DIAGNOSIS — L821 Other seborrheic keratosis: Secondary | ICD-10-CM | POA: Diagnosis not present

## 2023-03-01 DIAGNOSIS — D225 Melanocytic nevi of trunk: Secondary | ICD-10-CM | POA: Diagnosis not present

## 2023-03-22 DIAGNOSIS — Z01419 Encounter for gynecological examination (general) (routine) without abnormal findings: Secondary | ICD-10-CM | POA: Diagnosis not present

## 2023-03-22 DIAGNOSIS — N952 Postmenopausal atrophic vaginitis: Secondary | ICD-10-CM | POA: Diagnosis not present

## 2023-03-22 DIAGNOSIS — Z1231 Encounter for screening mammogram for malignant neoplasm of breast: Secondary | ICD-10-CM | POA: Diagnosis not present

## 2023-03-22 DIAGNOSIS — Z6824 Body mass index (BMI) 24.0-24.9, adult: Secondary | ICD-10-CM | POA: Diagnosis not present

## 2023-03-28 ENCOUNTER — Other Ambulatory Visit: Payer: Self-pay | Admitting: Obstetrics and Gynecology

## 2023-03-28 DIAGNOSIS — R928 Other abnormal and inconclusive findings on diagnostic imaging of breast: Secondary | ICD-10-CM

## 2023-04-13 ENCOUNTER — Ambulatory Visit
Admission: RE | Admit: 2023-04-13 | Discharge: 2023-04-13 | Disposition: A | Discharge status: Home / Self Care | Source: Ambulatory Visit | Attending: Obstetrics and Gynecology | Admitting: Obstetrics and Gynecology

## 2023-04-13 ENCOUNTER — Ambulatory Visit

## 2023-04-13 DIAGNOSIS — R928 Other abnormal and inconclusive findings on diagnostic imaging of breast: Secondary | ICD-10-CM | POA: Diagnosis not present

## 2023-04-26 DIAGNOSIS — E119 Type 2 diabetes mellitus without complications: Secondary | ICD-10-CM | POA: Diagnosis not present

## 2023-04-30 DIAGNOSIS — H2513 Age-related nuclear cataract, bilateral: Secondary | ICD-10-CM | POA: Diagnosis not present

## 2023-04-30 DIAGNOSIS — D3142 Benign neoplasm of left ciliary body: Secondary | ICD-10-CM | POA: Diagnosis not present

## 2023-12-31 DIAGNOSIS — Z0000 Encounter for general adult medical examination without abnormal findings: Secondary | ICD-10-CM | POA: Diagnosis not present

## 2023-12-31 DIAGNOSIS — E119 Type 2 diabetes mellitus without complications: Secondary | ICD-10-CM | POA: Diagnosis not present

## 2023-12-31 DIAGNOSIS — Z1331 Encounter for screening for depression: Secondary | ICD-10-CM | POA: Diagnosis not present
